# Patient Record
Sex: Female | Born: 1990 | Race: White | Hispanic: Yes | Marital: Married | Smoking: Never smoker
Health system: Southern US, Community
[De-identification: ages and names within clinical notes are randomized; demographics above are authoritative.]

## PROBLEM LIST (undated history)

## (undated) DIAGNOSIS — Z789 Other specified health status: Secondary | ICD-10-CM

## (undated) HISTORY — PX: APPENDECTOMY: SHX54

## (undated) HISTORY — PX: BREAST LUMPECTOMY: SHX2

## (undated) HISTORY — DX: Other specified health status: Z78.9

## (undated) HISTORY — PX: TONSILLECTOMY AND ADENOIDECTOMY: SUR1326

---

## 2019-03-06 NOTE — L&D Delivery Note (Signed)
Patient was C/C/+1 and pushed for 15 minutes with epidural.     FHTs were NST R but then started having decels to 60s-80s with every contraction.  Pt pushed to +2 and was consented for VE delivery- risks and benefits d/w the patient and she agreed to proceed.  VE placed and pulled one contraction to +4. Patient pushed 2 more contractions and one additional VE placement for half a contraction to crowning.  No popoffs.  SVD  female infant, Apgars 7,9, weight P.  Cord ph P. The patient had a small midline first degree laceration repaired with one stitch of 2-0 vicryl. Fundus was firm. EBL was expected amount. Placenta was delivered intact. Vagina was clear.  Delayed cord clamping WAS NOTdone as baby was handed imediatel to peds and stimulated immediately. Baby was then vigorous and doing skin to skin with mother.  Amy Mcdaniel

## 2019-04-14 ENCOUNTER — Encounter: Payer: Self-pay | Admitting: Obstetrics and Gynecology

## 2019-04-29 LAB — OB RESULTS CONSOLE RPR: RPR: NONREACTIVE

## 2019-04-29 LAB — OB RESULTS CONSOLE ABO/RH: RH Type: POSITIVE

## 2019-04-29 LAB — OB RESULTS CONSOLE HEPATITIS B SURFACE ANTIGEN: Hepatitis B Surface Ag: NEGATIVE

## 2019-04-29 LAB — OB RESULTS CONSOLE ANTIBODY SCREEN: Antibody Screen: NEGATIVE

## 2019-04-29 LAB — OB RESULTS CONSOLE RUBELLA ANTIBODY, IGM: Rubella: IMMUNE

## 2019-04-29 LAB — OB RESULTS CONSOLE HIV ANTIBODY (ROUTINE TESTING): HIV: NONREACTIVE

## 2019-09-16 ENCOUNTER — Other Ambulatory Visit: Payer: Self-pay | Admitting: Obstetrics and Gynecology

## 2019-09-16 DIAGNOSIS — O99212 Obesity complicating pregnancy, second trimester: Secondary | ICD-10-CM

## 2019-09-21 ENCOUNTER — Encounter: Payer: Self-pay | Admitting: *Deleted

## 2019-09-22 ENCOUNTER — Ambulatory Visit: Payer: Medicaid Other | Attending: Obstetrics and Gynecology

## 2019-09-22 ENCOUNTER — Other Ambulatory Visit: Payer: Self-pay

## 2019-09-22 ENCOUNTER — Ambulatory Visit: Payer: Medicaid Other | Admitting: *Deleted

## 2019-09-22 ENCOUNTER — Other Ambulatory Visit: Payer: Self-pay | Admitting: *Deleted

## 2019-09-22 VITALS — BP 112/82 | HR 91 | Ht 65.0 in | Wt 328.5 lb

## 2019-09-22 DIAGNOSIS — O99210 Obesity complicating pregnancy, unspecified trimester: Secondary | ICD-10-CM | POA: Diagnosis present

## 2019-09-22 DIAGNOSIS — O09293 Supervision of pregnancy with other poor reproductive or obstetric history, third trimester: Secondary | ICD-10-CM

## 2019-09-22 DIAGNOSIS — O99212 Obesity complicating pregnancy, second trimester: Secondary | ICD-10-CM

## 2019-09-22 DIAGNOSIS — Z3A31 31 weeks gestation of pregnancy: Secondary | ICD-10-CM

## 2019-09-22 DIAGNOSIS — O99213 Obesity complicating pregnancy, third trimester: Secondary | ICD-10-CM

## 2019-09-22 DIAGNOSIS — Z6841 Body Mass Index (BMI) 40.0 and over, adult: Secondary | ICD-10-CM

## 2019-09-22 DIAGNOSIS — Z363 Encounter for antenatal screening for malformations: Secondary | ICD-10-CM

## 2019-10-14 ENCOUNTER — Telehealth (HOSPITAL_COMMUNITY): Payer: Self-pay | Admitting: *Deleted

## 2019-10-14 NOTE — Telephone Encounter (Signed)
Induction scheduling error

## 2019-10-15 ENCOUNTER — Other Ambulatory Visit (HOSPITAL_COMMUNITY): Payer: Self-pay | Admitting: Obstetrics & Gynecology

## 2019-10-16 ENCOUNTER — Other Ambulatory Visit (HOSPITAL_COMMUNITY): Payer: Medicaid Other

## 2019-10-16 ENCOUNTER — Other Ambulatory Visit (HOSPITAL_COMMUNITY): Payer: Self-pay | Admitting: Obstetrics & Gynecology

## 2019-10-18 ENCOUNTER — Inpatient Hospital Stay (HOSPITAL_COMMUNITY): Payer: Medicaid Other

## 2019-10-20 ENCOUNTER — Ambulatory Visit: Payer: Medicaid Other | Attending: Obstetrics and Gynecology

## 2019-10-20 ENCOUNTER — Encounter: Payer: Self-pay | Admitting: *Deleted

## 2019-10-20 ENCOUNTER — Ambulatory Visit: Payer: Medicaid Other | Admitting: *Deleted

## 2019-10-20 ENCOUNTER — Other Ambulatory Visit: Payer: Self-pay

## 2019-10-20 VITALS — BP 120/78 | HR 92

## 2019-10-20 DIAGNOSIS — Z362 Encounter for other antenatal screening follow-up: Secondary | ICD-10-CM | POA: Diagnosis not present

## 2019-10-20 DIAGNOSIS — Z6841 Body Mass Index (BMI) 40.0 and over, adult: Secondary | ICD-10-CM

## 2019-10-20 DIAGNOSIS — Z3A35 35 weeks gestation of pregnancy: Secondary | ICD-10-CM | POA: Diagnosis not present

## 2019-10-20 DIAGNOSIS — O09293 Supervision of pregnancy with other poor reproductive or obstetric history, third trimester: Secondary | ICD-10-CM | POA: Diagnosis not present

## 2019-10-20 DIAGNOSIS — O99213 Obesity complicating pregnancy, third trimester: Secondary | ICD-10-CM | POA: Diagnosis not present

## 2019-10-27 LAB — OB RESULTS CONSOLE GBS: GBS: NEGATIVE

## 2019-11-05 ENCOUNTER — Telehealth (HOSPITAL_COMMUNITY): Payer: Self-pay | Admitting: *Deleted

## 2019-11-05 NOTE — Telephone Encounter (Signed)
Preadmission screen  

## 2019-11-10 ENCOUNTER — Encounter (HOSPITAL_COMMUNITY): Payer: Self-pay | Admitting: *Deleted

## 2019-11-16 ENCOUNTER — Other Ambulatory Visit (HOSPITAL_COMMUNITY)
Admission: RE | Admit: 2019-11-16 | Discharge: 2019-11-16 | Disposition: A | Discharge status: Home / Self Care | Payer: Medicaid Other | Source: Ambulatory Visit | Attending: Obstetrics & Gynecology | Admitting: Obstetrics & Gynecology

## 2019-11-16 DIAGNOSIS — Z01812 Encounter for preprocedural laboratory examination: Secondary | ICD-10-CM | POA: Insufficient documentation

## 2019-11-16 DIAGNOSIS — Z20822 Contact with and (suspected) exposure to covid-19: Secondary | ICD-10-CM | POA: Diagnosis not present

## 2019-11-16 LAB — SARS CORONAVIRUS 2 (TAT 6-24 HRS): SARS Coronavirus 2: NEGATIVE

## 2019-11-18 ENCOUNTER — Inpatient Hospital Stay (HOSPITAL_COMMUNITY): Payer: Medicaid Other

## 2019-11-19 ENCOUNTER — Other Ambulatory Visit: Payer: Self-pay

## 2019-11-19 ENCOUNTER — Inpatient Hospital Stay (HOSPITAL_COMMUNITY): Payer: Medicaid Other | Admitting: Anesthesiology

## 2019-11-19 ENCOUNTER — Inpatient Hospital Stay (HOSPITAL_COMMUNITY)
Admission: AD | Admit: 2019-11-19 | Discharge: 2019-11-20 | DRG: 807 | Disposition: A | Discharge status: Home / Self Care | Payer: Medicaid Other | Attending: Obstetrics & Gynecology | Admitting: Obstetrics & Gynecology

## 2019-11-19 ENCOUNTER — Encounter (HOSPITAL_COMMUNITY): Payer: Self-pay | Admitting: Obstetrics & Gynecology

## 2019-11-19 DIAGNOSIS — Z3A39 39 weeks gestation of pregnancy: Secondary | ICD-10-CM

## 2019-11-19 DIAGNOSIS — Z3490 Encounter for supervision of normal pregnancy, unspecified, unspecified trimester: Secondary | ICD-10-CM

## 2019-11-19 DIAGNOSIS — Z880 Allergy status to penicillin: Secondary | ICD-10-CM

## 2019-11-19 DIAGNOSIS — O99214 Obesity complicating childbirth: Secondary | ICD-10-CM | POA: Diagnosis present

## 2019-11-19 DIAGNOSIS — O26893 Other specified pregnancy related conditions, third trimester: Secondary | ICD-10-CM | POA: Diagnosis present

## 2019-11-19 LAB — CBC
HCT: 35.5 % — ABNORMAL LOW (ref 36.0–46.0)
Hemoglobin: 11.7 g/dL — ABNORMAL LOW (ref 12.0–15.0)
MCH: 28.5 pg (ref 26.0–34.0)
MCHC: 33 g/dL (ref 30.0–36.0)
MCV: 86.4 fL (ref 80.0–100.0)
Platelets: 258 10*3/uL (ref 150–400)
RBC: 4.11 MIL/uL (ref 3.87–5.11)
RDW: 15.4 % (ref 11.5–15.5)
WBC: 7.9 10*3/uL (ref 4.0–10.5)
nRBC: 0 % (ref 0.0–0.2)

## 2019-11-19 LAB — RPR: RPR Ser Ql: NONREACTIVE

## 2019-11-19 LAB — TYPE AND SCREEN
ABO/RH(D): O POS
Antibody Screen: NEGATIVE

## 2019-11-19 MED ORDER — ACETAMINOPHEN 325 MG PO TABS: 650.0000 mg | ORAL_TABLET | ORAL | Status: DC | PRN

## 2019-11-19 MED ORDER — ONDANSETRON HCL 4 MG/2ML IJ SOLN: 4.0000 mg | INTRAMUSCULAR | Status: DC | PRN

## 2019-11-19 MED ORDER — TETANUS-DIPHTH-ACELL PERTUSSIS 5-2.5-18.5 LF-MCG/0.5 IM SUSP: 0.5000 mL | Freq: Once | INTRAMUSCULAR | Status: DC

## 2019-11-19 MED ORDER — FENTANYL CITRATE (PF) 100 MCG/2ML IJ SOLN: 50.0000 ug | INTRAMUSCULAR | Status: DC | PRN

## 2019-11-19 MED ORDER — SODIUM CHLORIDE 0.9% FLUSH: 3.0000 mL | INTRAVENOUS | Status: DC | PRN

## 2019-11-19 MED ORDER — LACTATED RINGERS IV SOLN: 500.0000 mL | INTRAVENOUS | Status: DC | PRN

## 2019-11-19 MED ORDER — DIPHENHYDRAMINE HCL 50 MG/ML IJ SOLN: 12.5000 mg | INTRAMUSCULAR | Status: DC | PRN

## 2019-11-19 MED ORDER — OXYTOCIN BOLUS FROM INFUSION: 333.0000 mL | Freq: Once | INTRAVENOUS | Status: DC

## 2019-11-19 MED ORDER — COCONUT OIL OIL: 1.0000 "application " | TOPICAL_OIL | Status: DC | PRN

## 2019-11-19 MED ORDER — FERROUS SULFATE 325 (65 FE) MG PO TABS
325.0000 mg | ORAL_TABLET | Freq: Two times a day (BID) | ORAL | Status: DC
  Administered 2019-11-19 – 2019-11-20 (×3): 325 mg via ORAL
  Filled 2019-11-19 (×3): qty 1

## 2019-11-19 MED ORDER — ACETAMINOPHEN 325 MG PO TABS
650.0000 mg | ORAL_TABLET | ORAL | Status: DC | PRN
  Administered 2019-11-20: 650 mg via ORAL
  Filled 2019-11-19: qty 2

## 2019-11-19 MED ORDER — SODIUM CHLORIDE 0.9 % IV SOLN: 250.0000 mL | INTRAVENOUS | Status: DC | PRN

## 2019-11-19 MED ORDER — PHENYLEPHRINE 40 MCG/ML (10ML) SYRINGE FOR IV PUSH (FOR BLOOD PRESSURE SUPPORT)
80.0000 ug | PREFILLED_SYRINGE | INTRAVENOUS | Status: DC | PRN
  Administered 2019-11-19: 80 ug via INTRAVENOUS

## 2019-11-19 MED ORDER — OXYTOCIN-SODIUM CHLORIDE 30-0.9 UT/500ML-% IV SOLN
1.0000 m[IU]/min | INTRAVENOUS | Status: DC
  Administered 2019-11-19: 8 m[IU]/min via INTRAVENOUS
  Administered 2019-11-19: 12 m[IU]/min via INTRAVENOUS
  Administered 2019-11-19: 10 m[IU]/min via INTRAVENOUS
  Administered 2019-11-19: 4 m[IU]/min via INTRAVENOUS
  Administered 2019-11-19: 6 m[IU]/min via INTRAVENOUS
  Administered 2019-11-19: 2 m[IU]/min via INTRAVENOUS
  Filled 2019-11-19: qty 500

## 2019-11-19 MED ORDER — TERBUTALINE SULFATE 1 MG/ML IJ SOLN: 0.2500 mg | Freq: Once | INTRAMUSCULAR | Status: DC | PRN

## 2019-11-19 MED ORDER — METHYLERGONOVINE MALEATE 0.2 MG PO TABS: 0.2000 mg | ORAL_TABLET | ORAL | Status: DC | PRN

## 2019-11-19 MED ORDER — ONDANSETRON HCL 4 MG PO TABS: 4.0000 mg | ORAL_TABLET | ORAL | Status: DC | PRN

## 2019-11-19 MED ORDER — DIPHENHYDRAMINE HCL 25 MG PO CAPS: 25.0000 mg | ORAL_CAPSULE | Freq: Four times a day (QID) | ORAL | Status: DC | PRN

## 2019-11-19 MED ORDER — IBUPROFEN 800 MG PO TABS
800.0000 mg | ORAL_TABLET | Freq: Three times a day (TID) | ORAL | Status: DC
  Administered 2019-11-19 – 2019-11-20 (×4): 800 mg via ORAL
  Filled 2019-11-19 (×4): qty 1

## 2019-11-19 MED ORDER — OXYCODONE-ACETAMINOPHEN 5-325 MG PO TABS: 2.0000 | ORAL_TABLET | ORAL | Status: DC | PRN

## 2019-11-19 MED ORDER — SOD CITRATE-CITRIC ACID 500-334 MG/5ML PO SOLN: 30.0000 mL | ORAL | Status: DC | PRN

## 2019-11-19 MED ORDER — LIDOCAINE HCL (PF) 1 % IJ SOLN: 30.0000 mL | INTRAMUSCULAR | Status: DC | PRN

## 2019-11-19 MED ORDER — MEASLES, MUMPS & RUBELLA VAC IJ SOLR: 0.5000 mL | Freq: Once | INTRAMUSCULAR | Status: DC

## 2019-11-19 MED ORDER — METHYLERGONOVINE MALEATE 0.2 MG/ML IJ SOLN: 0.2000 mg | INTRAMUSCULAR | Status: DC | PRN

## 2019-11-19 MED ORDER — BENZOCAINE-MENTHOL 20-0.5 % EX AERO
1.0000 "application " | INHALATION_SPRAY | CUTANEOUS | Status: DC | PRN
  Filled 2019-11-19: qty 56

## 2019-11-19 MED ORDER — MAGNESIUM HYDROXIDE 400 MG/5ML PO SUSP: 30.0000 mL | ORAL | Status: DC | PRN

## 2019-11-19 MED ORDER — ONDANSETRON HCL 4 MG/2ML IJ SOLN: 4.0000 mg | Freq: Four times a day (QID) | INTRAMUSCULAR | Status: DC | PRN

## 2019-11-19 MED ORDER — EPHEDRINE 5 MG/ML INJ: 10.0000 mg | INTRAVENOUS | Status: DC | PRN

## 2019-11-19 MED ORDER — SIMETHICONE 80 MG PO CHEW: 80.0000 mg | CHEWABLE_TABLET | ORAL | Status: DC | PRN

## 2019-11-19 MED ORDER — DIBUCAINE (PERIANAL) 1 % EX OINT: 1.0000 "application " | TOPICAL_OINTMENT | CUTANEOUS | Status: DC | PRN

## 2019-11-19 MED ORDER — WITCH HAZEL-GLYCERIN EX PADS: 1.0000 "application " | MEDICATED_PAD | CUTANEOUS | Status: DC | PRN

## 2019-11-19 MED ORDER — FENTANYL CITRATE (PF) 2500 MCG/50ML IJ SOLN
INTRAMUSCULAR | Status: DC | PRN
Start: 2019-11-19 — End: 2019-11-19
  Administered 2019-11-19: 12 mL/h via EPIDURAL

## 2019-11-19 MED ORDER — SENNOSIDES-DOCUSATE SODIUM 8.6-50 MG PO TABS
2.0000 | ORAL_TABLET | ORAL | Status: DC
  Administered 2019-11-19: 2 via ORAL
  Filled 2019-11-19: qty 2

## 2019-11-19 MED ORDER — PRENATAL MULTIVITAMIN CH
1.0000 | ORAL_TABLET | Freq: Every day | ORAL | Status: DC
  Administered 2019-11-20: 1 via ORAL
  Filled 2019-11-19: qty 1

## 2019-11-19 MED ORDER — FENTANYL-BUPIVACAINE-NACL 0.5-0.125-0.9 MG/250ML-% EP SOLN
12.0000 mL/h | EPIDURAL | Status: DC | PRN
  Filled 2019-11-19: qty 250

## 2019-11-19 MED ORDER — PHENYLEPHRINE 40 MCG/ML (10ML) SYRINGE FOR IV PUSH (FOR BLOOD PRESSURE SUPPORT)
80.0000 ug | PREFILLED_SYRINGE | INTRAVENOUS | Status: DC | PRN
  Filled 2019-11-19: qty 10

## 2019-11-19 MED ORDER — ZOLPIDEM TARTRATE 5 MG PO TABS: 5.0000 mg | ORAL_TABLET | Freq: Every evening | ORAL | Status: DC | PRN

## 2019-11-19 MED ORDER — OXYTOCIN-SODIUM CHLORIDE 30-0.9 UT/500ML-% IV SOLN: 2.5000 [IU]/h | INTRAVENOUS | Status: DC

## 2019-11-19 MED ORDER — LIDOCAINE HCL (PF) 1 % IJ SOLN
INTRAMUSCULAR | Status: DC | PRN
  Administered 2019-11-19 (×2): 4 mL via EPIDURAL

## 2019-11-19 MED ORDER — SODIUM CHLORIDE 0.9% FLUSH: 3.0000 mL | Freq: Two times a day (BID) | INTRAVENOUS | Status: DC

## 2019-11-19 MED ORDER — LACTATED RINGERS IV SOLN: INTRAVENOUS | Status: DC

## 2019-11-19 MED ORDER — LACTATED RINGERS IV SOLN: 500.0000 mL | Freq: Once | INTRAVENOUS | Status: DC

## 2019-11-19 NOTE — Anesthesia Procedure Notes (Signed)
Epidural Patient location during procedure: OB Start time: 11/19/2019 12:56 PM End time: 11/19/2019 1:05 PM  Staffing Anesthesiologist: Mal Amabile, MD Performed: anesthesiologist   Preanesthetic Checklist Completed: patient identified, IV checked, site marked, risks and benefits discussed, surgical consent, monitors and equipment checked, pre-op evaluation and timeout performed  Epidural Patient position: sitting Prep: DuraPrep and site prepped and draped Patient monitoring: continuous pulse ox and blood pressure Approach: midline Location: L3-L4 Injection technique: LOR air  Needle:  Needle type: Tuohy  Needle gauge: 17 G Needle length: 9 cm and 9 Needle insertion depth: 9 cm Catheter type: closed end flexible Catheter size: 19 Gauge Catheter at skin depth: 15 cm Test dose: negative and Other  Assessment Events: blood not aspirated, injection not painful, no injection resistance, no paresthesia and negative IV test  Additional Notes Patient identified. Risks and benefits discussed including failed block, incomplete  Pain control, post dural puncture headache, nerve damage, paralysis, blood pressure Changes, nausea, vomiting, reactions to medications-both toxic and allergic and post Partum back pain. All questions were answered. Patient expressed understanding and wished to proceed. Sterile technique was used throughout procedure. Epidural site was Dressed with sterile barrier dressing. No paresthesias, signs of intravascular injection Or signs of intrathecal spread were encountered.  Patient was more comfortable after the epidural was dosed. Please see RN's note for documentation of vital signs and FHR which are stable. Reason for block:procedure for pain

## 2019-11-19 NOTE — Anesthesia Preprocedure Evaluation (Signed)
Anesthesia Evaluation  Patient identified by MRN, date of birth, ID band Patient awake    Reviewed: Allergy & Precautions, Patient's Chart, lab work & pertinent test results  Airway Mallampati: II  TM Distance: >3 FB Neck ROM: Full    Dental no notable dental hx. (+) Teeth Intact   Pulmonary neg pulmonary ROS,    Pulmonary exam normal breath sounds clear to auscultation       Cardiovascular negative cardio ROS Normal cardiovascular exam Rhythm:Regular Rate:Normal     Neuro/Psych negative neurological ROS  negative psych ROS   GI/Hepatic Neg liver ROS, GERD  ,  Endo/Other  Morbid obesity  Renal/GU negative Renal ROS  negative genitourinary   Musculoskeletal negative musculoskeletal ROS (+)   Abdominal (+) + obese,   Peds  Hematology  (+) anemia ,   Anesthesia Other Findings   Reproductive/Obstetrics (+) Pregnancy                             Anesthesia Physical Anesthesia Plan  ASA: III  Anesthesia Plan: Epidural   Post-op Pain Management:    Induction:   PONV Risk Score and Plan:   Airway Management Planned: Natural Airway  Additional Equipment:   Intra-op Plan:   Post-operative Plan:   Informed Consent: I have reviewed the patients History and Physical, chart, labs and discussed the procedure including the risks, benefits and alternatives for the proposed anesthesia with the patient or authorized representative who has indicated his/her understanding and acceptance.       Plan Discussed with: Anesthesiologist  Anesthesia Plan Comments:         Anesthesia Quick Evaluation

## 2019-11-19 NOTE — H&P (Signed)
29 y.o. [redacted]w[redacted]d  [id number] comes in for elective induction at term.  Otherwise has good fetal movement and no bleeding.  Past Medical History:  Diagnosis Date  . Medical history non-contributory     Past Surgical History:  Procedure Laterality Date  . APPENDECTOMY    . BREAST LUMPECTOMY    . TONSILLECTOMY AND ADENOIDECTOMY      OB History  Gravida Para Term Preterm AB Living  4 2 2   1 2   SAB TAB Ectopic Multiple Live Births  1       2    # Outcome Date GA Lbr Len/2nd Weight Sex Delivery Anes PTL Lv  4 Current           3 Term 02/22/18 [redacted]w[redacted]d   M Vag-Spont   LIV  2 Term 07/19/15 [redacted]w[redacted]d   F    LIV  1 SAB 03/05/14 [redacted]w[redacted]d           Social History   Socioeconomic History  . Marital status: Married    Spouse name: Ludwin   . Number of children: 2  . Years of education: College  . Highest education level: Associate degree: occupational, [redacted]w[redacted]d, or vocational program  Occupational History  . Not on file  Tobacco Use  . Smoking status: Never Smoker  . Smokeless tobacco: Never Used  Vaping Use  . Vaping Use: Never used  Substance and Sexual Activity  . Alcohol use: Not Currently  . Drug use: Never  . Sexual activity: Not Currently    Birth control/protection: Abstinence  Other Topics Concern  . Not on file  Social History Narrative  . Not on file   Social Determinants of Health   Financial Resource Strain:   . Difficulty of Paying Living Expenses: Not on file  Food Insecurity:   . Worried About Scientist, product/process development in the Last Year: Not on file  . Ran Out of Food in the Last Year: Not on file  Transportation Needs:   . Lack of Transportation (Medical): Not on file  . Lack of Transportation (Non-Medical): Not on file  Physical Activity:   . Days of Exercise per Week: Not on file  . Minutes of Exercise per Session: Not on file  Stress:   . Feeling of Stress : Not on file  Social Connections:   . Frequency of Communication with Friends and Family: Not on file  .  Frequency of Social Gatherings with Friends and Family: Not on file  . Attends Religious Services: Not on file  . Active Member of Clubs or Organizations: Not on file  . Attends Programme researcher, broadcasting/film/video Meetings: Not on file  . Marital Status: Not on file  Intimate Partner Violence:   . Fear of Current or Ex-Partner: Not on file  . Emotionally Abused: Not on file  . Physically Abused: Not on file  . Sexually Abused: Not on file   Amoxicillin    Prenatal Transfer Tool  Maternal Diabetes: No Genetic Screening: Normal Maternal Ultrasounds/Referrals: Normal Fetal Ultrasounds or other Referrals:  None Maternal Substance Abuse:  No Significant Maternal Medications:  None Significant Maternal Lab Results: Group B Strep negative  Other PNC: uncomplicated except for BMI >50.  Poly earlier was resolved.      Vitals:   11/19/19 0739 11/19/19 0758  BP: 126/68 126/68  Pulse: 84 89  Resp:  17  Temp:  98.4 F (36.9 C)  TempSrc:  Oral  Weight:  (!) 152.7 kg  Height:  5'  4" (1.626 m)    Lungs/Cor:  NAD Abdomen:  soft, gravid Ex:  no cords, erythema SVE:  P FHTs:  130s, good STV, NST R; Cat 1 tracing. Toco:  q occ   A/P   Term elective induction [id number]   GBS Neg.   Loney Laurence

## 2019-11-20 LAB — CBC
HCT: 29.8 % — ABNORMAL LOW (ref 36.0–46.0)
Hemoglobin: 9.7 g/dL — ABNORMAL LOW (ref 12.0–15.0)
MCH: 28.2 pg (ref 26.0–34.0)
MCHC: 32.6 g/dL (ref 30.0–36.0)
MCV: 86.6 fL (ref 80.0–100.0)
Platelets: 232 10*3/uL (ref 150–400)
RBC: 3.44 MIL/uL — ABNORMAL LOW (ref 3.87–5.11)
RDW: 15.6 % — ABNORMAL HIGH (ref 11.5–15.5)
WBC: 11 10*3/uL — ABNORMAL HIGH (ref 4.0–10.5)
nRBC: 0 % (ref 0.0–0.2)

## 2019-11-20 MED ORDER — IBUPROFEN 600 MG PO TABS: 600.0000 mg | ORAL_TABLET | Freq: Four times a day (QID) | ORAL | 0 refills | Status: AC | PRN

## 2019-11-20 NOTE — Discharge Summary (Signed)
Postpartum Discharge Summary  Date of Service 9/16-9/17     Patient Name: Amy Mcdaniel DOB: 11-04-1990 MRN: [id number]  Date of admission: 11/19/2019 Delivery date:11/19/2019  Delivering provider: Bobbye Charleston  Date of discharge: 11/20/2019  Admitting diagnosis: Term pregnancy [Z34.90] Intrauterine pregnancy: [redacted]w[redacted]d    Secondary diagnosis:  Active Problems:   Term pregnancy      Discharge diagnosis: Term Pregnancy Delivered                                              Post partum procedures:none Augmentation: AROM and Pitocin Complications: None  Hospital course: Induction of Labor With Vaginal Delivery   29y.o. yo [phone] [id number] admitted to the hospital 11/19/2019 for induction of labor.  Indication for induction: Elective.  Patient had an uncomplicated labor course as follows: Membrane Rupture Time/Date: 9:40 AM ,11/19/2019   Delivery Method:Vaginal, Vacuum (Extractor)  Episiotomy: None  Lacerations:  1st degree;Perineal  Details of delivery can be found in separate delivery note.  Patient had a routine postpartum course. Patient is discharged home 11/20/19.  Newborn Data: Birth date:11/19/2019  Birth time:2:38 PM  Gender:Female  Living status:Living  Apgars:7 ,9  Weight:3760 g   Magnesium Sulfate received: No BMZ received: No Rhophylac:N/A MMR:N/A T-DaP:Given prenatally Flu: No Transfusion:No  Physical exam  Vitals:   11/19/19 1754 11/19/19 2135 11/20/19 0128 11/20/19 0511  BP: 139/81 138/89 127/78 112/60  Pulse: 72 92 88 97  Resp:  _0 Temp: 98.2 F (36.8 C) 98.3 F (36.8 C) 97.7 F (36.5 C) 98.2 F (36.8 C)  TempSrc: Oral Oral Oral Oral  SpO2: 99% 97% 96% 96%  Weight:      Height:       General: alert, cooperative and no distress Lochia: appropriate Uterine Fundus: firm DVT Evaluation: No evidence of DVT seen on physical exam. Labs: Lab Results  Component Value Date   WBC 11.0 (H) 11/20/2019   HGB 9.7 (L) 11/20/2019   HCT  29.8 (L) 11/20/2019   MCV 86.6 11/20/2019   PLT 232 11/20/2019   No flowsheet data found. Edinburgh Score: Edinburgh Postnatal Depression Scale Screening Tool 11/20/2019  I have been able to laugh and see the funny side of things. 0  I have looked forward with enjoyment to things. 0  I have blamed myself unnecessarily when things went wrong. 1  I have been anxious or worried for no good reason. 1  I have felt scared or panicky for no good reason. 1  Things have been getting on top of me. 0  I have been so unhappy that I have had difficulty sleeping. 0  I have felt sad or miserable. 0  I have been so unhappy that I have been crying. 0  The thought of harming myself has occurred to me. 0  Edinburgh Postnatal Depression Scale Total 3      After visit meds:  Allergies as of 11/20/2019      Reactions   Amoxicillin Hives      Medication List    STOP taking these medications   aspirin EC 81 MG tablet   multivitamin-prenatal 27-0.8 MG Tabs tablet     TAKE these medications   ibuprofen 600 MG tablet Commonly known as: ADVIL Take 1 tablet (600 mg total) by mouth every 6 (six) hours as needed.  Discharge home in stable condition Infant Feeding: Bottle Infant Disposition:home with mother Discharge instruction: per After Visit Summary and Postpartum booklet. Activity: Advance as tolerated. Pelvic rest for 6 weeks.  Diet: routine diet Anticipated Birth Control: Unsure Postpartum Appointment:4 weeks Future Appointments:No future appointments. Follow up Visit:  Follow-up Information    Bobbye Charleston, MD Follow up.   Specialty: Obstetrics and Gynecology Why: in 4 weeks for a postpartum evaluation Contact information: [address] [place] [address] [phone]                   11/20/2019 Vanessa Kick, MD

## 2019-11-20 NOTE — Progress Notes (Signed)
Post Partum Day 1  Subjective: no complaints, up ad lib, voiding and tolerating PO  Objective: Blood pressure 112/60, pulse 97, temperature 98.2 F (36.8 C), temperature source Oral, resp. rate 18, height 5\' 4"  (1.626 m), weight (!) 152.7 kg, last menstrual period 02/14/2019, SpO2 96 %, unknown if currently breastfeeding.  Physical Exam:  General: alert, cooperative and appears stated age Lochia: appropriate Uterine Fundus: firm DVT Evaluation: No evidence of DVT seen on physical exam.  Recent Labs    11/19/19 0814 11/20/19 0508  HGB 11.7* 9.7*  HCT 35.5* 29.8*    Assessment/Plan: 1) routine postpartum care 2) Desires neonatal circumcision, R/B/A of procedure discussed at length. Pt understands that neonatal circumcision is not considered medically necessary and is elective. The risks include, but are not limited to bleeding, infection, damage to the penis, development of scar tissue, and having to have it redone at a later date. Pt understands theses risks and wishes to proceed 3) patient desires early discharge today if baby can be discharged home.   LOS: 1 day   11/22/19 11/20/2019, 11:49 AM

## 2019-11-20 NOTE — Anesthesia Postprocedure Evaluation (Signed)
Anesthesia Post Note  Patient: Coumba Coster  Procedure(s) Performed: AN AD HOC LABOR EPIDURAL     Patient location during evaluation: Mother Baby Anesthesia Type: Epidural Level of consciousness: awake and alert and oriented Pain management: satisfactory to patient Vital Signs Assessment: post-procedure vital signs reviewed and stable Respiratory status: respiratory function stable Cardiovascular status: stable Postop Assessment: no headache, no backache, epidural receding, patient able to bend at knees, no signs of nausea or vomiting, adequate PO intake and able to ambulate Anesthetic complications: no   No complications documented.  Last Vitals:  Vitals:   11/20/19 0128 11/20/19 0511  BP: 127/78 112/60  Pulse: 88 97  Resp: 18 18  Temp: 36.5 C 36.8 C  SpO2: 96% 96%    Last Pain:  Vitals:   11/20/19 0511  TempSrc: Oral  PainSc: 0-No pain   Pain Goal: Patients Stated Pain Goal: 0 (11/19/19 1214)                 Luisantonio Adinolfi
